# Patient Record
Sex: Female | Born: 1980
Health system: Southern US, Community
[De-identification: ages and names within clinical notes are randomized; demographics above are authoritative.]

## PROBLEM LIST (undated history)

## (undated) DIAGNOSIS — F988 Other specified behavioral and emotional disorders with onset usually occurring in childhood and adolescence: Secondary | ICD-10-CM

## (undated) DIAGNOSIS — F419 Anxiety disorder, unspecified: Secondary | ICD-10-CM

## (undated) DIAGNOSIS — F329 Major depressive disorder, single episode, unspecified: Secondary | ICD-10-CM

## (undated) DIAGNOSIS — D481 Neoplasm of uncertain behavior of connective and other soft tissue: Secondary | ICD-10-CM

## (undated) DIAGNOSIS — F603 Borderline personality disorder: Secondary | ICD-10-CM

## (undated) DIAGNOSIS — K219 Gastro-esophageal reflux disease without esophagitis: Secondary | ICD-10-CM

## (undated) DIAGNOSIS — F32A Depression, unspecified: Secondary | ICD-10-CM

## (undated) DIAGNOSIS — R159 Full incontinence of feces: Secondary | ICD-10-CM

## (undated) DIAGNOSIS — Z9884 Bariatric surgery status: Secondary | ICD-10-CM

## (undated) DIAGNOSIS — M170 Bilateral primary osteoarthritis of knee: Secondary | ICD-10-CM

## (undated) HISTORY — DX: Neoplasm of uncertain behavior of connective and other soft tissue: D48.1

## (undated) HISTORY — PX: CHOLECYSTECTOMY: SHX55

## (undated) HISTORY — DX: Borderline personality disorder: F60.3

## (undated) HISTORY — DX: Depression, unspecified: F32.A

## (undated) HISTORY — DX: Bilateral primary osteoarthritis of knee: M17.0

## (undated) HISTORY — DX: Anxiety disorder, unspecified: F41.9

## (undated) HISTORY — DX: Bariatric surgery status: Z98.84

## (undated) HISTORY — DX: Gastro-esophageal reflux disease without esophagitis: K21.9

## (undated) HISTORY — DX: Full incontinence of feces: R15.9

## (undated) HISTORY — DX: Other specified behavioral and emotional disorders with onset usually occurring in childhood and adolescence: F98.8

---
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
## 2006-07-23 HISTORY — PX: BLADDER SURGERY: SHX569

## 2006-07-23 HISTORY — PX: URETER REVISION: SHX493

## 2006-07-23 HISTORY — PX: ABDOMINAL HYSTERECTOMY: SHX81

## 2008-11-08 ENCOUNTER — Encounter: Payer: Self-pay | Admitting: Family Medicine

## 2014-07-23 HISTORY — PX: SLEEVE GASTROPLASTY: SHX1101

## 2015-04-25 DIAGNOSIS — K219 Gastro-esophageal reflux disease without esophagitis: Secondary | ICD-10-CM | POA: Insufficient documentation

## 2015-11-07 DIAGNOSIS — Z90710 Acquired absence of both cervix and uterus: Secondary | ICD-10-CM | POA: Insufficient documentation

## 2015-11-07 DIAGNOSIS — Z90721 Acquired absence of ovaries, unilateral: Secondary | ICD-10-CM | POA: Insufficient documentation

## 2017-03-21 DIAGNOSIS — F32A Depression, unspecified: Secondary | ICD-10-CM | POA: Insufficient documentation

## 2017-03-21 DIAGNOSIS — F419 Anxiety disorder, unspecified: Secondary | ICD-10-CM | POA: Insufficient documentation

## 2017-03-21 DIAGNOSIS — F603 Borderline personality disorder: Secondary | ICD-10-CM | POA: Insufficient documentation

## 2018-07-01 DIAGNOSIS — K629 Disease of anus and rectum, unspecified: Secondary | ICD-10-CM | POA: Insufficient documentation

## 2019-03-14 ENCOUNTER — Other Ambulatory Visit: Payer: Self-pay

## 2019-03-14 DIAGNOSIS — Z20822 Contact with and (suspected) exposure to covid-19: Secondary | ICD-10-CM

## 2019-03-15 LAB — NOVEL CORONAVIRUS, NAA: SARS-CoV-2, NAA: NOT DETECTED

## 2019-08-13 ENCOUNTER — Ambulatory Visit: Payer: Medicare Other | Attending: Internal Medicine

## 2019-08-13 ENCOUNTER — Other Ambulatory Visit: Payer: Self-pay

## 2019-08-13 ENCOUNTER — Ambulatory Visit (INDEPENDENT_AMBULATORY_CARE_PROVIDER_SITE_OTHER): Payer: Medicare Other | Admitting: Adult Health Nurse Practitioner

## 2019-08-13 VITALS — BP 114/88 | HR 100 | Temp 98.2°F | Ht 64.0 in | Wt 266.2 lb

## 2019-08-13 DIAGNOSIS — R430 Anosmia: Secondary | ICD-10-CM

## 2019-08-13 DIAGNOSIS — R432 Parageusia: Secondary | ICD-10-CM

## 2019-08-13 DIAGNOSIS — R0602 Shortness of breath: Secondary | ICD-10-CM

## 2019-08-13 DIAGNOSIS — Z20822 Contact with and (suspected) exposure to covid-19: Secondary | ICD-10-CM

## 2019-08-13 MED ORDER — ALBUTEROL SULFATE HFA 108 (90 BASE) MCG/ACT IN AERS: 2.0000 | INHALATION_SPRAY | Freq: Four times a day (QID) | RESPIRATORY_TRACT | 1 refills | Status: DC | PRN

## 2019-08-13 NOTE — Patient Instructions (Signed)
° ° ° °  If you have lab work done today you will be contacted with your lab results within the next 2 weeks.  If you have not heard from us then please contact us. The fastest way to get your results is to register for My Chart. ° ° °IF you received an x-ray today, you will receive an invoice from [facility]. Please contact [facility] at [phone] with questions or concerns regarding your invoice.  ° °IF you received labwork today, you will receive an invoice from [facility]. Please contact [facility] at [phone] with questions or concerns regarding your invoice.  ° °Our billing staff will not be able to assist you with questions regarding bills from these companies. ° °You will be contacted with the lab results as soon as they are available. The fastest way to get your results is to activate your My Chart account. Instructions are located on the last page of this paperwork. If you have not heard from us regarding the results in 2 weeks, please contact this office. °  ° ° ° °

## 2019-08-13 NOTE — Progress Notes (Signed)
Attempted to call the patient for virtual visit and left message.

## 2019-08-13 NOTE — Progress Notes (Signed)
Patient presented to the appointment with URI symptoms that she declined to communicate prior to being roomed. She currently has a loss of taste and smell and SOB.  I immediately asked the patient to get a Covid test and isolate until she receives results of a negative test.  If results are positive, she would need to quarantine for 10 days.  She requested an Albuterol inhaler.  I filled this and will call her for further information at a virtual visit this afternoon.   She verbalized understanding.

## 2019-08-14 LAB — NOVEL CORONAVIRUS, NAA: SARS-CoV-2, NAA: NOT DETECTED

## 2019-08-20 ENCOUNTER — Ambulatory Visit: Payer: Self-pay | Admitting: Family Medicine

## 2019-09-08 ENCOUNTER — Encounter: Payer: Medicare Other | Admitting: Family Medicine

## 2019-09-08 ENCOUNTER — Telehealth: Payer: Self-pay | Admitting: Adult Health Nurse Practitioner

## 2019-09-08 NOTE — Telephone Encounter (Signed)
Please Advise

## 2019-09-08 NOTE — Telephone Encounter (Signed)
Pt would like a curtsey refill on her  traZODone (DESYREL) 100 MG tablet [id number] (PHENERGAN) 12.5 MG tablet [id number], pantoprazole (PROTONIX) 40 MG tablet [id number] , gabapentin (NEURONTIN) 800 MG tablet [id number],  escitalopram (LEXAPRO) 10 MG tablet [id number]  clonazePAM (KLONOPIN) 2 MG tablet [id number] amphetamine-dextroamphetamine (ADDERALL XR) 30 MG 24 hr capsule [id number]  meloxicam (MOBIC) 15 MG tablet [id number]  Pharmacy  [place], [place].  [address] [place] [address]  Phone:  [phone] Fax:  [phone]     Pt has a upcoming appointment with Dr. Pamella Pert on 10/13/19 for a toc. Please advise pt at [phone]

## 2019-09-10 ENCOUNTER — Encounter: Payer: Self-pay | Admitting: Family Medicine

## 2019-09-10 NOTE — Telephone Encounter (Signed)
Can we find an appointment much sooner for this patient? thanks

## 2019-09-18 ENCOUNTER — Other Ambulatory Visit: Payer: Self-pay

## 2019-09-18 ENCOUNTER — Ambulatory Visit (INDEPENDENT_AMBULATORY_CARE_PROVIDER_SITE_OTHER): Payer: Medicare Other | Admitting: Family Medicine

## 2019-09-18 ENCOUNTER — Encounter: Payer: Self-pay | Admitting: Family Medicine

## 2019-09-18 VITALS — BP 127/85 | HR 100 | Temp 97.9°F | Ht 64.0 in | Wt 271.6 lb

## 2019-09-18 DIAGNOSIS — Z13220 Encounter for screening for lipoid disorders: Secondary | ICD-10-CM

## 2019-09-18 DIAGNOSIS — D481 Neoplasm of uncertain behavior of connective and other soft tissue: Secondary | ICD-10-CM | POA: Insufficient documentation

## 2019-09-18 DIAGNOSIS — F411 Generalized anxiety disorder: Secondary | ICD-10-CM

## 2019-09-18 DIAGNOSIS — K219 Gastro-esophageal reflux disease without esophagitis: Secondary | ICD-10-CM

## 2019-09-18 DIAGNOSIS — R159 Full incontinence of feces: Secondary | ICD-10-CM | POA: Insufficient documentation

## 2019-09-18 DIAGNOSIS — F988 Other specified behavioral and emotional disorders with onset usually occurring in childhood and adolescence: Secondary | ICD-10-CM | POA: Diagnosis not present

## 2019-09-18 DIAGNOSIS — F603 Borderline personality disorder: Secondary | ICD-10-CM

## 2019-09-18 DIAGNOSIS — Z9884 Bariatric surgery status: Secondary | ICD-10-CM | POA: Insufficient documentation

## 2019-09-18 DIAGNOSIS — F332 Major depressive disorder, recurrent severe without psychotic features: Secondary | ICD-10-CM

## 2019-09-18 DIAGNOSIS — Z131 Encounter for screening for diabetes mellitus: Secondary | ICD-10-CM

## 2019-09-18 DIAGNOSIS — Z79899 Other long term (current) drug therapy: Secondary | ICD-10-CM | POA: Diagnosis not present

## 2019-09-18 DIAGNOSIS — M170 Bilateral primary osteoarthritis of knee: Secondary | ICD-10-CM | POA: Insufficient documentation

## 2019-09-18 DIAGNOSIS — D4819 Other specified neoplasm of uncertain behavior of connective and other soft tissue: Secondary | ICD-10-CM | POA: Insufficient documentation

## 2019-09-18 MED ORDER — CLONAZEPAM 2 MG PO TABS: 2.0000 mg | ORAL_TABLET | Freq: Two times a day (BID) | ORAL | 0 refills | Status: DC

## 2019-09-18 MED ORDER — AMPHETAMINE-DEXTROAMPHET ER 30 MG PO CP24: 30.0000 mg | ORAL_CAPSULE | Freq: Every day | ORAL | 0 refills | Status: DC
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
## 2019-09-18 NOTE — Patient Instructions (Signed)
° ° ° °  If you have lab work done today you will be contacted with your lab results within the next 2 weeks.  If you have not heard from us then please contact us. The fastest way to get your results is to register for My Chart. ° ° °IF you received an x-ray today, you will receive an invoice from [facility]. Please contact [facility] at [phone] with questions or concerns regarding your invoice.  ° °IF you received labwork today, you will receive an invoice from [facility]. Please contact [facility] at [phone] with questions or concerns regarding your invoice.  ° °Our billing staff will not be able to assist you with questions regarding bills from these companies. ° °You will be contacted with the lab results as soon as they are available. The fastest way to get your results is to activate your My Chart account. Instructions are located on the last page of this paperwork. If you have not heard from us regarding the results in 2 weeks, please contact this office. °  ° ° ° °

## 2019-09-22 LAB — TOXASSURE SELECT 13 (MW), URINE

## 2019-09-23 ENCOUNTER — Encounter: Payer: Self-pay | Admitting: Family Medicine

## 2019-09-23 ENCOUNTER — Other Ambulatory Visit: Payer: Self-pay | Admitting: Family Medicine

## 2019-09-23 NOTE — Telephone Encounter (Signed)
Patient is requesting a refill of the following medications: Requested Prescriptions   Pending Prescriptions Disp Refills  . clonazePAM (KLONOPIN) 2 MG tablet 30 tablet 0    Sig: Take 1 tablet (2 mg total) by mouth 2 (two) times daily.    Date of patient request: 09/23/2019 Last office visit: 09/18/2019 Date of last refill: 09/18/2019 Last refill amount: 30 tablets twice daily Follow up time period per chart: Follow up appointment scheduled.   Patient stated that she sent you a picture of the same prescription of Clonazepam but for 60 tablets instead of 30 for the month , and was wondering if its possible if she could receive 60 tablets again.

## 2019-09-24 MED ORDER — CLONAZEPAM 2 MG PO TABS: 2.0000 mg | ORAL_TABLET | Freq: Two times a day (BID) | ORAL | 0 refills | Status: DC

## 2019-09-24 NOTE — Telephone Encounter (Signed)
Prescription corrected and resent

## 2019-09-24 NOTE — Telephone Encounter (Signed)
Already addressed in another message.

## 2019-09-25 ENCOUNTER — Ambulatory Visit: Payer: Medicare Other | Admitting: Family Medicine

## 2019-10-05 ENCOUNTER — Encounter: Payer: Self-pay | Admitting: Family Medicine

## 2019-10-05 NOTE — Telephone Encounter (Signed)
Pt is prescribed Adderall and is stating this is why it is positive. Please advise.

## 2019-10-05 NOTE — Telephone Encounter (Signed)
Pt has sent message asking why you placed the comment of "inapropriate" on their TOXASSURE UDS from 09/22/2019 looks positive for Methamphetamine. Please advise action.

## 2019-10-13 ENCOUNTER — Ambulatory Visit: Payer: Medicare Other

## 2019-10-13 ENCOUNTER — Telehealth: Payer: Self-pay | Admitting: Family Medicine

## 2019-10-13 ENCOUNTER — Encounter: Payer: Medicare Other | Admitting: Family Medicine

## 2019-10-19 ENCOUNTER — Ambulatory Visit (INDEPENDENT_AMBULATORY_CARE_PROVIDER_SITE_OTHER): Payer: Medicare Other | Admitting: Family Medicine

## 2019-10-19 ENCOUNTER — Other Ambulatory Visit: Payer: Self-pay

## 2019-10-19 ENCOUNTER — Encounter: Payer: Self-pay | Admitting: Family Medicine

## 2019-10-19 VITALS — BP 109/78 | HR 71 | Temp 98.0°F | Ht 64.0 in | Wt 263.6 lb

## 2019-10-19 DIAGNOSIS — Z131 Encounter for screening for diabetes mellitus: Secondary | ICD-10-CM | POA: Diagnosis not present

## 2019-10-19 DIAGNOSIS — F332 Major depressive disorder, recurrent severe without psychotic features: Secondary | ICD-10-CM | POA: Diagnosis not present

## 2019-10-19 DIAGNOSIS — Z9884 Bariatric surgery status: Secondary | ICD-10-CM

## 2019-10-19 DIAGNOSIS — Z01419 Encounter for gynecological examination (general) (routine) without abnormal findings: Secondary | ICD-10-CM

## 2019-10-19 DIAGNOSIS — F988 Other specified behavioral and emotional disorders with onset usually occurring in childhood and adolescence: Secondary | ICD-10-CM

## 2019-10-19 DIAGNOSIS — Z13220 Encounter for screening for lipoid disorders: Secondary | ICD-10-CM | POA: Diagnosis not present

## 2019-10-19 DIAGNOSIS — Z79899 Other long term (current) drug therapy: Secondary | ICD-10-CM | POA: Diagnosis not present

## 2019-10-19 DIAGNOSIS — Z0001 Encounter for general adult medical examination with abnormal findings: Secondary | ICD-10-CM | POA: Diagnosis not present

## 2019-10-19 DIAGNOSIS — Z0000 Encounter for general adult medical examination without abnormal findings: Secondary | ICD-10-CM

## 2019-10-19 MED ORDER — AMPHETAMINE-DEXTROAMPHET ER 30 MG PO CP24: 30.0000 mg | ORAL_CAPSULE | Freq: Every day | ORAL | 0 refills | Status: DC

## 2019-10-19 NOTE — Patient Instructions (Addendum)
   If you have lab work done today you will be contacted with your lab results within the next 2 weeks.  If you have not heard from us then please contact us. The fastest way to get your results is to register for My Chart.   IF you received an x-ray today, you will receive an invoice from [facility]. Please contact [facility] at [phone] with questions or concerns regarding your invoice.   IF you received labwork today, you will receive an invoice from [facility]. Please contact [facility] at [phone] with questions or concerns regarding your invoice.   Our billing staff will not be able to assist you with questions regarding bills from these companies.  You will be contacted with the lab results as soon as they are available. The fastest way to get your results is to activate your My Chart account. Instructions are located on the last page of this paperwork. If you have not heard from us regarding the results in 2 weeks, please contact this office.     Preventive Care 40-64 Years Old, Female Preventive care refers to visits with your health care provider and lifestyle choices that can promote health and wellness. This includes:  A yearly physical exam. This may also be called an annual well check.  Regular dental visits and eye exams.  Immunizations.  Screening for certain conditions.  Healthy lifestyle choices, such as eating a healthy diet, getting regular exercise, not using drugs or products that contain nicotine and tobacco, and limiting alcohol use. What can I expect for my preventive care visit? Physical exam Your health care provider will check your:  Height and weight. This may be used to calculate body mass index (BMI), which tells if you are at a healthy weight.  Heart rate and blood pressure.  Skin for abnormal spots. Counseling Your health care provider may ask you questions about your:  Alcohol, tobacco, and drug use.  Emotional  well-being.  Home and relationship well-being.  Sexual activity.  Eating habits.  Work and work environment.  Method of birth control.  Menstrual cycle.  Pregnancy history. What immunizations do I need?  Influenza (flu) vaccine  This is recommended every year. Tetanus, diphtheria, and pertussis (Tdap) vaccine  You may need a Td booster every 10 years. Varicella (chickenpox) vaccine  You may need this if you have not been vaccinated. Zoster (shingles) vaccine  You may need this after age 60. Measles, mumps, and rubella (MMR) vaccine  You may need at least one dose of MMR if you were born in 1957 or later. You may also need a second dose. Pneumococcal conjugate (PCV13) vaccine  You may need this if you have certain conditions and were not previously vaccinated. Pneumococcal polysaccharide (PPSV23) vaccine  You may need one or two doses if you smoke cigarettes or if you have certain conditions. Meningococcal conjugate (MenACWY) vaccine  You may need this if you have certain conditions. Hepatitis A vaccine  You may need this if you have certain conditions or if you travel or work in places where you may be exposed to hepatitis A. Hepatitis B vaccine  You may need this if you have certain conditions or if you travel or work in places where you may be exposed to hepatitis B. Haemophilus influenzae type b (Hib) vaccine  You may need this if you have certain conditions. Human papillomavirus (HPV) vaccine  If recommended by your health care provider, you may need three doses over 6 months.   You may receive vaccines as individual doses or as more than one vaccine together in one shot (combination vaccines). Talk with your health care provider about the risks and benefits of combination vaccines. What tests do I need? Blood tests  Lipid and cholesterol levels. These may be checked every 5 years, or more frequently if you are over 50 years old.  Hepatitis C  test.  Hepatitis B test. Screening  Lung cancer screening. You may have this screening every year starting at age 55 if you have a 30-pack-year history of smoking and currently smoke or have quit within the past 15 years.  Colorectal cancer screening. All adults should have this screening starting at age 50 and continuing until age 75. Your health care provider may recommend screening at age 45 if you are at increased risk. You will have tests every 1-10 years, depending on your results and the type of screening test.  Diabetes screening. This is done by checking your blood sugar (glucose) after you have not eaten for a while (fasting). You may have this done every 1-3 years.  Mammogram. This may be done every 1-2 years. Talk with your health care provider about when you should start having regular mammograms. This may depend on whether you have a family history of breast cancer.  BRCA-related cancer screening. This may be done if you have a family history of breast, ovarian, tubal, or peritoneal cancers.  Pelvic exam and Pap test. This may be done every 3 years starting at age 21. Starting at age 30, this may be done every 5 years if you have a Pap test in combination with an HPV test. Other tests  Sexually transmitted disease (STD) testing.  Bone density scan. This is done to screen for osteoporosis. You may have this scan if you are at high risk for osteoporosis. Follow these instructions at home: Eating and drinking  Eat a diet that includes fresh fruits and vegetables, whole grains, lean protein, and low-fat dairy.  Take vitamin and mineral supplements as recommended by your health care provider.  Do not drink alcohol if: ? Your health care provider tells you not to drink. ? You are pregnant, may be pregnant, or are planning to become pregnant.  If you drink alcohol: ? Limit how much you have to 0-1 drink a day. ? Be aware of how much alcohol is in your drink. In the [place] one  drink equals one 12 oz bottle of beer (355 mL), one 5 oz glass of wine (148 mL), or one 1 oz glass of hard liquor (44 mL). Lifestyle  Take daily care of your teeth and gums.  Stay active. Exercise for at least 30 minutes on 5 or more days each week.  Do not use any products that contain nicotine or tobacco, such as cigarettes, e-cigarettes, and chewing tobacco. If you need help quitting, ask your health care provider.  If you are sexually active, practice safe sex. Use a condom or other form of birth control (contraception) in order to prevent pregnancy and STIs (sexually transmitted infections).  If told by your health care provider, take low-dose aspirin daily starting at age 50. What's next?  Visit your health care provider once a year for a well check visit.  Ask your health care provider how often you should have your eyes and teeth checked.  Stay up to date on all vaccines. This information is not intended to replace advice given to you by your health care provider. Make sure   you discuss any questions you have with your health care provider. Document Revised: 03/20/2018 Document Reviewed: 03/20/2018 Elsevier Patient Education  2020 [employer]  
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
## 2019-10-20 LAB — HEMOGLOBIN A1C
Est. average glucose Bld gHb Est-mCnc: 117 mg/dL
Hgb A1c MFr Bld: 5.7 % — ABNORMAL HIGH (ref 4.8–5.6)

## 2019-10-20 LAB — CBC
Hematocrit: 40.7 % (ref 34.0–46.6)
Hemoglobin: 13.4 g/dL (ref 11.1–15.9)
MCH: 30.3 pg (ref 26.6–33.0)
MCHC: 32.9 g/dL (ref 31.5–35.7)
MCV: 92 fL (ref 79–97)
Platelets: 308 10*3/uL (ref 150–450)
RBC: 4.42 x10E6/uL (ref 3.77–5.28)
RDW: 14 % (ref 11.7–15.4)
WBC: 5.8 10*3/uL (ref 3.4–10.8)

## 2019-10-20 LAB — IRON,TIBC AND FERRITIN PANEL
Ferritin: 19 ng/mL (ref 15–150)
Iron Saturation: 10 % — ABNORMAL LOW (ref 15–55)
Iron: 36 ug/dL (ref 27–159)
Total Iron Binding Capacity: 377 ug/dL (ref 250–450)
UIBC: 341 ug/dL (ref 131–425)

## 2019-10-20 LAB — COMPREHENSIVE METABOLIC PANEL
ALT: 24 IU/L (ref 0–32)
AST: 27 IU/L (ref 0–40)
Albumin/Globulin Ratio: 1.7 (ref 1.2–2.2)
Albumin: 4.1 g/dL (ref 3.8–4.8)
Alkaline Phosphatase: 69 IU/L (ref 39–117)
BUN/Creatinine Ratio: 10 (ref 9–23)
BUN: 9 mg/dL (ref 6–24)
Bilirubin Total: 0.2 mg/dL (ref 0.0–1.2)
CO2: 22 mmol/L (ref 20–29)
Calcium: 9.4 mg/dL (ref 8.7–10.2)
Chloride: 106 mmol/L (ref 96–106)
Creatinine, Ser: 0.91 mg/dL (ref 0.57–1.00)
GFR calc Af Amer: 91 mL/min/{1.73_m2} (ref >59)
GFR calc non Af Amer: 79 mL/min/{1.73_m2} (ref >59)
Globulin, Total: 2.4 g/dL (ref 1.5–4.5)
Glucose: 89 mg/dL (ref 65–99)
Potassium: 4.1 mmol/L (ref 3.5–5.2)
Sodium: 142 mmol/L (ref 134–144)
Total Protein: 6.5 g/dL (ref 6.0–8.5)

## 2019-10-20 LAB — LIPID PANEL
Chol/HDL Ratio: 3.6 ratio (ref 0.0–4.4)
Cholesterol, Total: 161 mg/dL (ref 100–199)
HDL: 45 mg/dL (ref >39)
LDL Chol Calc (NIH): 87 mg/dL (ref 0–99)
Triglycerides: 171 mg/dL — ABNORMAL HIGH (ref 0–149)
VLDL Cholesterol Cal: 29 mg/dL (ref 5–40)

## 2019-10-20 LAB — VITAMIN B12: Vitamin B-12: >2000 pg/mL — ABNORMAL HIGH (ref 232–1245)

## 2019-10-20 LAB — TSH: TSH: 1.17 u[IU]/mL (ref 0.450–4.500)

## 2019-10-20 LAB — VITAMIN D 25 HYDROXY (VIT D DEFICIENCY, FRACTURES): Vit D, 25-Hydroxy: 52 ng/mL (ref 30.0–100.0)

## 2019-11-04 ENCOUNTER — Other Ambulatory Visit: Payer: Self-pay

## 2019-11-04 ENCOUNTER — Ambulatory Visit (HOSPITAL_COMMUNITY): Payer: Medicare Other | Admitting: Psychiatry

## 2019-11-09 ENCOUNTER — Other Ambulatory Visit: Payer: Self-pay | Admitting: Family Medicine

## 2019-11-09 DIAGNOSIS — F988 Other specified behavioral and emotional disorders with onset usually occurring in childhood and adolescence: Secondary | ICD-10-CM

## 2019-11-09 NOTE — Telephone Encounter (Signed)
Requested medication (s) are due for refill today: yes  Requested medication (s) are on the active medication list: yes  Last refill:  09/24/19  #60  0 refills  Future visit scheduled: No  Notes to clinic:  Not delegated    Requested Prescriptions  Pending Prescriptions Disp Refills   clonazePAM (KLONOPIN) 2 MG tablet [Pharmacy Med Name: clonazePAM 2 MG Oral Tablet] 30 tablet 0    Sig: Take 1 tablet by mouth twice daily      Not Delegated - Psychiatry:  Anxiolytics/Hypnotics Failed - 11/09/2019  5:22 PM      Failed - This refill cannot be delegated      Failed - Urine Drug Screen completed in last 360 days.      Passed - Valid encounter within last 6 months    Recent Outpatient Visits           3 weeks ago Encounter for annual wellness exam in Medicare patient   [facility], Lilia Argue, MD   1 month ago S/P laparoscopic sleeve gastrectomy   [facility], Lilia Argue, MD   2 months ago Loss of taste   [facility] at [facility], Lorelee Market, NP

## 2019-11-10 ENCOUNTER — Encounter: Payer: Self-pay | Admitting: Family Medicine

## 2019-11-10 ENCOUNTER — Other Ambulatory Visit: Payer: Self-pay | Admitting: Family Medicine

## 2019-11-10 MED ORDER — AMPHETAMINE-DEXTROAMPHET ER 30 MG PO CP24: 30.0000 mg | ORAL_CAPSULE | Freq: Every day | ORAL | 0 refills | Status: DC

## 2019-11-10 MED ORDER — CLONAZEPAM 2 MG PO TABS: 2.0000 mg | ORAL_TABLET | Freq: Two times a day (BID) | ORAL | 0 refills | Status: DC

## 2019-11-10 NOTE — Telephone Encounter (Signed)
Patient is requesting a refill of the following medications: Requested Prescriptions   Pending Prescriptions Disp Refills  . clonazePAM (KLONOPIN) 2 MG tablet [Pharmacy Med Name: clonazePAM 2 MG Oral Tablet] 30 tablet 0    Sig: Take 1 tablet by mouth twice daily    Date of patient request: 11/10/19 Last office visit: 10/19/2019 Date of last refill:09/24/2019 Last refill amount: 60 tablets Follow up time period per chart: N/A

## 2019-11-10 NOTE — Telephone Encounter (Signed)
pmp reviewd, appropriate meds refilled Has appt with psych April 29th

## 2019-11-10 NOTE — Telephone Encounter (Signed)
Patient is requesting a refill of the following medications: Requested Prescriptions   Pending Prescriptions Disp Refills  . clonazePAM (KLONOPIN) 2 MG tablet 60 tablet 0    Sig: Take 1 tablet (2 mg total) by mouth 2 (two) times daily.  Marland Kitchen amphetamine-dextroamphetamine (ADDERALL XR) 30 MG 24 hr capsule 30 capsule 0    Sig: Take 1 capsule (30 mg total) by mouth daily.    Date of patient request: 11/10/2019 Last office visit: 10/19/19 Date of last refill: 09/24/2019 & 10/19/2019 Last refill amount: 60 tablets Follow up time period per chart:N/A

## 2019-11-11 ENCOUNTER — Encounter: Payer: Self-pay | Admitting: Family Medicine

## 2019-11-11 NOTE — Telephone Encounter (Signed)
Called patient to let her know that she sent too many request that's why it was showing denied because the first prescription was already approved.

## 2019-11-12 MED ORDER — HYDROCODONE-ACETAMINOPHEN 10-325 MG PO TABS: 1.0000 | ORAL_TABLET | Freq: Four times a day (QID) | ORAL | 0 refills | Status: DC | PRN

## 2019-11-12 NOTE — Telephone Encounter (Signed)
Pt is requesting 1 week of pain medication for her oral surgery /tooth extraction is this possible? If she needs a visit is it possible to be a Video visit.

## 2019-11-13 ENCOUNTER — Telehealth: Payer: Self-pay | Admitting: Family Medicine

## 2019-11-13 NOTE — Telephone Encounter (Signed)
Pt declined collecting the 1 day Rx from dentist and requested the fill for the 1 week rx I clarified we were treating her post procedure pain just as he dentis had been. Pharmacy will dispense the 1 week Rx we had sent.

## 2019-11-13 NOTE — Telephone Encounter (Signed)
[facility] is calling  Because pt has  multiple prescriptions for hydocondone from dentist on 11/10/19 script was written hydrocodone 10-325 for 14 tablets a4 day supply . Then, On 11/11/2219 dentist sent another for 4 tablets to last one day . Then we wrote script  On 04.22/23 for a week . Pharmacy needs clarification on prescription .  Please call Dustin at [phone]

## 2019-11-19 ENCOUNTER — Encounter (HOSPITAL_COMMUNITY): Payer: Self-pay | Admitting: Psychiatry

## 2019-11-19 ENCOUNTER — Other Ambulatory Visit: Payer: Self-pay

## 2019-11-19 ENCOUNTER — Telehealth (INDEPENDENT_AMBULATORY_CARE_PROVIDER_SITE_OTHER): Payer: Medicare Other | Admitting: Psychiatry

## 2019-11-19 DIAGNOSIS — F39 Unspecified mood [affective] disorder: Secondary | ICD-10-CM

## 2019-11-19 DIAGNOSIS — F603 Borderline personality disorder: Secondary | ICD-10-CM

## 2019-11-19 DIAGNOSIS — F419 Anxiety disorder, unspecified: Secondary | ICD-10-CM

## 2019-11-19 DIAGNOSIS — F902 Attention-deficit hyperactivity disorder, combined type: Secondary | ICD-10-CM | POA: Diagnosis not present

## 2019-11-19 MED ORDER — BUPROPION HCL ER (XL) 300 MG PO TB24: 300.0000 mg | ORAL_TABLET | Freq: Every day | ORAL | 0 refills | Status: DC

## 2019-11-19 MED ORDER — AMPHETAMINE-DEXTROAMPHET ER 20 MG PO CP24: 20.0000 mg | ORAL_CAPSULE | Freq: Every day | ORAL | 0 refills | Status: DC

## 2019-11-19 MED ORDER — ARIPIPRAZOLE 5 MG PO TABS: ORAL_TABLET | ORAL | 0 refills | Status: DC

## 2019-11-19 MED ORDER — CLONAZEPAM 1 MG PO TABS: 1.0000 mg | ORAL_TABLET | Freq: Two times a day (BID) | ORAL | 0 refills | Status: DC | PRN
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
## 2019-11-24 ENCOUNTER — Telehealth: Payer: Self-pay

## 2019-11-24 ENCOUNTER — Telehealth: Payer: Self-pay | Admitting: Family Medicine

## 2019-11-24 NOTE — Telephone Encounter (Signed)
Doctor has the forms, will review and complete for the pt. My Olivia Vargas has been contacted about delay

## 2019-11-24 NOTE — Telephone Encounter (Signed)
Pt came in to drop off forms for doctor completion. Copies of the forms were given to asst to be kept at the nurses station. Call was made to [facility], spoke with Lexine Baton which sent message to Nonnie Done to inform that forms will be delayed due to doctor review. [employer] [id number] ext [phone], Claim Number [id number]. Forms will be faxed to [id number].

## 2019-11-24 NOTE — Telephone Encounter (Signed)
FMLA paperwork dropped off by patient put in provider. CMA box 05.04.2021 154.00 collected patient needs completed by 11/26/2019 if any delay contact Rocco  [employer] at [phone]   [phone]

## 2019-11-25 ENCOUNTER — Encounter: Payer: Self-pay | Admitting: Family Medicine

## 2019-11-26 ENCOUNTER — Other Ambulatory Visit: Payer: Self-pay

## 2019-11-26 NOTE — Telephone Encounter (Signed)
Please write patient letter. thanks

## 2019-12-02 ENCOUNTER — Encounter: Payer: Self-pay | Admitting: Family Medicine

## 2019-12-02 NOTE — Telephone Encounter (Signed)
Again please write patient a letter confirming that indeed she has long standing history of anxiety and is under the care of a psychiatrist and to please reconsider recent insurance denial for use of conscious sedation during dental procedure. thanks

## 2019-12-02 NOTE — Telephone Encounter (Signed)
Patient came by for a letter / still waiting for a note to sent to my chart  /please see her my chart message .    Patient is anxious please reach to patient and let her know where we are at on her request

## 2019-12-03 ENCOUNTER — Telehealth: Payer: Self-pay

## 2019-12-03 ENCOUNTER — Encounter: Payer: Self-pay | Admitting: Family Medicine

## 2019-12-03 NOTE — Telephone Encounter (Signed)
Patient informed letter for insurance is complete. Patient prefers to pick up in office .

## 2019-12-03 NOTE — Telephone Encounter (Signed)
Called pt to let her know that the fmla forms for [employer] have been faxed to [id number]. Pt also requested letter, letter given to pt

## 2019-12-15 ENCOUNTER — Telehealth (INDEPENDENT_AMBULATORY_CARE_PROVIDER_SITE_OTHER): Payer: Medicare Other | Admitting: Psychiatry

## 2019-12-15 ENCOUNTER — Other Ambulatory Visit: Payer: Self-pay

## 2019-12-15 DIAGNOSIS — F419 Anxiety disorder, unspecified: Secondary | ICD-10-CM | POA: Diagnosis not present

## 2019-12-15 DIAGNOSIS — F902 Attention-deficit hyperactivity disorder, combined type: Secondary | ICD-10-CM | POA: Diagnosis not present

## 2019-12-15 DIAGNOSIS — F603 Borderline personality disorder: Secondary | ICD-10-CM | POA: Diagnosis not present

## 2019-12-15 DIAGNOSIS — F39 Unspecified mood [affective] disorder: Secondary | ICD-10-CM | POA: Diagnosis not present

## 2019-12-15 MED ORDER — LAMOTRIGINE 25 MG PO TABS: ORAL_TABLET | ORAL | 1 refills | Status: DC

## 2019-12-15 MED ORDER — CLONAZEPAM 1 MG PO TABS: ORAL_TABLET | ORAL | 0 refills | Status: DC

## 2019-12-15 MED ORDER — AMPHETAMINE-DEXTROAMPHET ER 20 MG PO CP24: 20.0000 mg | ORAL_CAPSULE | Freq: Every day | ORAL | 0 refills | Status: DC

## 2019-12-15 MED ORDER — BUPROPION HCL ER (XL) 300 MG PO TB24: 300.0000 mg | ORAL_TABLET | Freq: Every day | ORAL | 0 refills | Status: DC

## 2019-12-15 NOTE — Progress Notes (Signed)
Virtual Visit via Video Note  I connected with Olivia Vargas on 12/15/19 at  2:00 PM EDT by a video enabled telemedicine application and verified that I am speaking with the correct person using two identifiers.   I discussed the limitations of evaluation and management by telemedicine and the availability of in person appointments. The patient expressed understanding and agreed to proceed.  Patient location; home Provider location; home office   History of Present Illness: Patient is evaluated by phone session.  She is a 39 year old Caucasian female who was seen 3 weeks ago as referred from her PCP Dr. Pamella Pert.  She was taking multiple and moderate dose of psychotropic medication.  She was taking Adderall, Klonopin, Lexapro, trazodone, Wellbutrin from her previous PCP in [facility].  She recently moved with her fianc and now like to establish care with she felt.  We started her on Abilify to help her mood lability, impulsive behavior, mania and anger.  However after few days of Abilify she stopped taking Abilify because she felt it was causing side effects.  She also read the side effects and she got scared.  However she did not specify the side effects.  We have discontinued trazodone and Lexapro and reduce the dose of Klonopin and Adderall.  She is on disability due to tremors of smooth muscles.  She lives with her fianc.  Both parents are deceased.  She has no children.  She is still struggling with her mood symptoms and sometimes gets irritable and angry.   Past Psychiatric History: H/O inpatient in 2014 at [facility].    Saw a day Mark and prescribed multiple medication.  Diagnosed with borderline personality disorder.  No h/o suicidal attempt.  H/O mood swings, impulsive behavior, anxiety and depression.  Given Adderall for ADD but no psychological testing.  Had tried Effexor, Cymbalta and lithium in the past.  Given Lexapro, Wellbutrin,  trazodone, Adderall and Klonopin by PCP at [facility] care, [facility] by Dr. Blondell Reveal.  We tried Abilify but she stopped after a few days with nonspecific side effects.   Psychiatric Specialty Exam: Physical Exam  Review of Systems  There were no vitals taken for this visit.There is no height or weight on file to calculate BMI.  General Appearance: Casual  Eye Contact:  Fair  Speech:  Slow  Volume:  Normal  Mood:  Anxious and Irritable  Affect:  Labile  Thought Process:  Descriptions of Associations: Intact  Orientation:  Full (Time, Place, and Person)  Thought Content:  Rumination  Suicidal Thoughts:  No  Homicidal Thoughts:  No  Memory:  Immediate;   Fair Recent;   Fair Remote;   Fair  Judgement:  Fair  Insight:  Fair  Psychomotor Activity:  Normal  Concentration:  Concentration: Fair and Attention Span: Fair  Recall:  [employer] of Knowledge:  Good  Language:  Good  Akathisia:  No  Handed:  Right  AIMS (if indicated):     Assets:  Communication Skills Desire for Improvement Housing  ADL's:  Intact  Cognition:  WNL  Sleep:   ok      Assessment and Plan: Mood disorder NOS.  Anxiety.  Borderline personality disorder.  ADHD.  I had a long discussion with the patient about taking multiple medication especially stimulants and benzodiazepine.  Patient has no formal testing for ADHD.  She stopped Abilify because she felt having side effects and she was scared with the  side effects that she read.  I will discontinue Abilify but insist that she should take something to help her mood lability.  We will try Lamictal which she has never tried before.  We will start 25 mg daily for 1 week and then 50 mg daily.  I explained if she develops rash then she need to stop the medication.  She will continue Wellbutrin XL 300 mg daily, she was taking Klonopin 2 mg twice a day and we had cut down to 1 mg twice a day and I like her to try Klonopin 1 mg at bedtime and  half tablet during the day as needed for severe anxiety.  I will continue Adderall XR 20 mg to help her focus and attention but she will need a psychological testing to rule out ADHD.  I discussed benzodiazepine dependence, tolerance, abuse and withdrawal symptoms.  So far patient has no side effects from the medication.  Recommend to call us back if she has any question or any concerns.  Follow-up in 4 weeks.  Time spent 25 minutes.  Follow Up Instructions:    I discussed the assessment and treatment plan with the patient. The patient was provided an opportunity to ask questions and all were answered. The patient agreed with the plan and demonstrated an understanding of the instructions.   The patient was advised to call back or seek an in-person evaluation if the symptoms worsen or if the condition fails to improve as anticipated.  I provided 25 minutes of non-face-to-face time during this encounter.   Kathlee Nations, MD

## 2019-12-22 ENCOUNTER — Ambulatory Visit (INDEPENDENT_AMBULATORY_CARE_PROVIDER_SITE_OTHER): Payer: Medicare Other | Admitting: Licensed Clinical Social Worker

## 2019-12-22 ENCOUNTER — Other Ambulatory Visit: Payer: Self-pay

## 2019-12-22 DIAGNOSIS — F603 Borderline personality disorder: Secondary | ICD-10-CM | POA: Diagnosis not present

## 2019-12-22 DIAGNOSIS — F39 Unspecified mood [affective] disorder: Secondary | ICD-10-CM

## 2019-12-22 DIAGNOSIS — F411 Generalized anxiety disorder: Secondary | ICD-10-CM
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
## 2020-01-14 ENCOUNTER — Telehealth (INDEPENDENT_AMBULATORY_CARE_PROVIDER_SITE_OTHER): Payer: Medicare Other | Admitting: Psychiatry

## 2020-01-14 ENCOUNTER — Other Ambulatory Visit: Payer: Self-pay

## 2020-01-14 DIAGNOSIS — F39 Unspecified mood [affective] disorder: Secondary | ICD-10-CM

## 2020-01-14 DIAGNOSIS — F419 Anxiety disorder, unspecified: Secondary | ICD-10-CM | POA: Diagnosis not present

## 2020-01-14 DIAGNOSIS — F902 Attention-deficit hyperactivity disorder, combined type: Secondary | ICD-10-CM | POA: Diagnosis not present

## 2020-01-14 MED ORDER — CLONAZEPAM 1 MG PO TABS: ORAL_TABLET | ORAL | 1 refills | Status: DC

## 2020-01-14 MED ORDER — AMPHETAMINE-DEXTROAMPHET ER 15 MG PO CP24: 15.0000 mg | ORAL_CAPSULE | Freq: Every day | ORAL | 0 refills | Status: AC

## 2020-01-14 MED ORDER — LAMOTRIGINE 25 MG PO TABS: ORAL_TABLET | ORAL | 1 refills | Status: DC

## 2020-01-14 MED ORDER — ESCITALOPRAM OXALATE 10 MG PO TABS: 10.0000 mg | ORAL_TABLET | Freq: Every day | ORAL | 1 refills | Status: AC

## 2020-01-14 MED ORDER — BUPROPION HCL ER (XL) 300 MG PO TB24: 300.0000 mg | ORAL_TABLET | Freq: Every day | ORAL | 1 refills | Status: DC

## 2020-01-14 NOTE — Progress Notes (Addendum)
Virtual Visit via Telephone Note  I connected with Olivia Vargas on 01/14/20 at  2:40 PM EDT by telephone and verified that I am speaking with the correct person using two identifiers.   I discussed the limitations, risks, security and privacy concerns of performing an evaluation and management service by telephone and the availability of in person appointments. I also discussed with the patient that there may be a patient responsible charge related to this service. The patient expressed understanding and agreed to proceed.   Patient Location: home Provider Location: Home Office  History of Present Illness: Patient is evaluated by phone session.  She is taking Lamictal and so far she feels it is helping her mood irritability and impulsive behavior.  Recently she visited her home [place] as she is trying to go back settled there.  She was cleaning the house and she is not sure if she got dust for exposed to [place] as she is feeling pitting issues and sometimes shortness of breath.  She has no fever.  She also not sure if the Lamictal has causing the side effects.  But overall she feels the Lamictal helped her mood and irritability.  She is sleeping okay.  She decided to go back on Lexapro but she is no longer taking Abilify.  We had reduced her stimulant and Klonopin but she is still taking every day.  We talked about controlled substance as patient does not have a formal diagnosis of ADHD.  However she insists that Adderall helped her focus attention.  She is also taking Wellbutrin.  She had 1 visit with Lovena Le, her therapist and she is hoping to have more appointments.  She denies any paranoia, hallucination or any suicidal thoughts.  She is on disability due to tremors of smooth muscles.  She lives with her fianc.  Past Psychiatric History: [facility]   Saw a day Mark and prescribed multiple medication.  Diagnosed with borderline  personality disorder.  No h/o suicidal attempt. H/O mood swings, impulsive behavior, anxiety and depression. Given Adderall for ADD but no psychological testing. Had tried Effexor, Cymbalta and lithium in the past. Given Lexapro, Wellbutrin, trazodone, Adderall and Klonopin by PCP at [facility] primary care, [facility] by Dr. Blondell Reveal.  We tried Abilify but she stopped after a few days with nonspecific side effects.   Psychiatric Specialty Exam: Physical Exam  Review of Systems  There were no vitals taken for this visit.There is no height or weight on file to calculate BMI.  General Appearance: NA  Eye Contact:  NA  Speech:  Slow  Volume:  Normal  Mood:  Anxious and Irritable  Affect:  NA  Thought Process:  Descriptions of Associations: Intact  Orientation:  Full (Time, Place, and Person)  Thought Content:  Rumination  Suicidal Thoughts:  No  Homicidal Thoughts:  No  Memory:  Immediate;   Good Recent;   Fair Remote;   Fair  Judgement:  Fair  Insight:  NA  Psychomotor Activity:  NA  Concentration:  Concentration: Fair and Attention Span: Fair  Recall:  Good  Fund of Knowledge:  Good  Language:  Good  Akathisia:  No  Handed:  Right  AIMS (if indicated):     Assets:  Communication Skills Desire for Improvement Housing Resilience Social Support  ADL's:  Intact  Cognition:  WNL  Sleep:   ok      Assessment and Plan: Mood disorder NOS.  Anxiety.  ADHD by history.  I discussed with the patient about taking multiple medication and stimulant and benzodiazepine use.  Once again I reinforced that she should get the psychological testing to rule out ADHD.  I also recommend she should see her PCP to find out the cause of cough as patient is not sure if it is a dust allergy or exposed to [place].  It is unlikely Lamictal because cough as this is not the common side effects.  She has no rash, itching, headaches or tremors.  She did not like the Lamictal and I  recommend if she is tolerating it okay then she can try taking 3 pills a day.  We talked about reducing stimulant and benzodiazepine.  Though she is not happy but she is willing to try.  She like to go back on Lexapro since it is helping her mood.  We will resume Lexapro 10 mg daily, continue Wellbutrin XL 300 mg daily and increase Lamictal 75 mg daily.  We will also reduce Adderall from 20 mg to 15 mg and Klonopin from 1.5 mg to only 1 mg at bedtime.  Patient is trying to get psychological testing.  Encouraged to continue therapy with her therapist.  I will forward my note to her PCP Dr. Pamella Pert as requested by patient.  I encourage she should follow-up with her.  Discussed safety concern that anytime having active suicidal thoughts or homicidal thought that she need to call 911 or go to local emergency room.  Follow-up in 2 months.    Follow Up Instructions:    I discussed the assessment and treatment plan with the patient. The patient was provided an opportunity to ask questions and all were answered. The patient agreed with the plan and demonstrated an understanding of the instructions.   The patient was advised to call back or seek an in-person evaluation if the symptoms worsen or if the condition fails to improve as anticipated.  I provided 20 minutes of non-face-to-face time during this encounter.   Kathlee Nations, MD

## 2020-02-08 ENCOUNTER — Telehealth (INDEPENDENT_AMBULATORY_CARE_PROVIDER_SITE_OTHER): Payer: Medicare Other | Admitting: Family Medicine

## 2020-02-08 ENCOUNTER — Encounter: Payer: Self-pay | Admitting: Family Medicine

## 2020-02-08 ENCOUNTER — Telehealth: Payer: Self-pay | Admitting: Family Medicine

## 2020-02-08 ENCOUNTER — Other Ambulatory Visit: Payer: Self-pay

## 2020-02-08 DIAGNOSIS — R062 Wheezing: Secondary | ICD-10-CM | POA: Diagnosis not present

## 2020-02-08 DIAGNOSIS — Z77120 Contact with and (suspected) exposure to mold (toxic): Secondary | ICD-10-CM

## 2020-02-08 MED ORDER — ALBUTEROL SULFATE HFA 108 (90 BASE) MCG/ACT IN AERS: 2.0000 | INHALATION_SPRAY | Freq: Four times a day (QID) | RESPIRATORY_TRACT | 2 refills | Status: AC | PRN

## 2020-02-08 MED ORDER — GABAPENTIN 800 MG PO TABS: ORAL_TABLET | ORAL | 1 refills | Status: AC

## 2020-02-08 MED ORDER — HYDROXYZINE HCL 25 MG PO TABS: 12.5000 mg | ORAL_TABLET | Freq: Three times a day (TID) | ORAL | 0 refills | Status: DC | PRN

## 2020-02-08 MED ORDER — PREDNISONE 20 MG PO TABS
40.0000 mg | ORAL_TABLET | Freq: Every day | ORAL | 0 refills | Status: DC
Start: 2020-02-08 — End: 2020-03-08

## 2020-02-08 NOTE — Telephone Encounter (Signed)
02/08/2020 - PATIENT HAD A DOXIMITY APPOINTMENT WITH DR. Benay Spice ON Monday (02/08/2020). DR. Benay Spice HAS REQUESTED SHE RETURN FOR A 1 WEEK FOLLOW-UP IN THE OFFICE NEXT Monday (02/15/2020). SHE HAS GIVEN HER PERMISSION TO OVER BOOK THIS PATIENT. I TRIED TO CALL AND SCHEDULE BUT HAD TO LEAVE A MESSAGE FOR HER TO RETURN MY CALL. [place]

## 2020-02-08 NOTE — Patient Instructions (Signed)
° ° ° °  If you have lab work done today you will be contacted with your lab results within the next 2 weeks.  If you have not heard from us then please contact us. The fastest way to get your results is to register for My Chart. ° ° °IF you received an x-ray today, you will receive an invoice from [facility]. Please contact [facility] at [phone] with questions or concerns regarding your invoice.  ° °IF you received labwork today, you will receive an invoice from [facility]. Please contact [facility] at [phone] with questions or concerns regarding your invoice.  ° °Our billing staff will not be able to assist you with questions regarding bills from these companies. ° °You will be contacted with the lab results as soon as they are available. The fastest way to get your results is to activate your My Chart account. Instructions are located on the last page of this paperwork. If you have not heard from us regarding the results in 2 weeks, please contact this office. °  ° ° ° °

## 2020-02-08 NOTE — Progress Notes (Signed)
Virtual Visit Note  I connected with patient on 02/08/20 at 1128am by video doximity and verified that I am speaking with the correct person using two identifiers. Olivia Vargas is currently located at home and patient is currently with them during visit. The provider, Rutherford Guys, MD is located in their home at time of visit.  I discussed the limitations, risks, security and privacy concerns of performing an evaluation and management service by telephone and the availability of in person appointments. I also discussed with the patient that there may be a patient responsible charge related to this service. The patient expressed understanding and agreed to proceed.   I provided 11 minutes of non-face-to-face time during this encounter.  Chief Complaint  Patient presents with  . URI    wheezing, chest congested/ cough , runny nose/ itching eyes & all over   . Medication Management    taking self off lamictal - requesting trazadone rx     HPI ? Having runny nose, nasal congestion, very itchy (no rash) Coughing, mildly productive Having chills, no sweats, nofevers Itchy throat, watery eyes No real sneezing Started about a week ago Has mold in her apt Has air purifier Having SOB and wheezing for past 3-4 months Albuterol not helping Quit smoking 2 day ago Has childhood asthma  Takes gabapentin for neuropathy Sees psych regularly, adj meds, last ov June 2021  No Known Allergies  Prior to Admission medications   Medication Sig Start Date End Date Taking? Authorizing Provider  amphetamine-dextroamphetamine (ADDERALL XR) 15 MG 24 hr capsule Take 1 capsule by mouth daily. 01/14/20 02/13/20 Yes Arfeen, Arlyce Harman, MD  buPROPion (WELLBUTRIN XL) 300 MG 24 hr tablet Take 1 tablet (300 mg total) by mouth daily. 01/14/20  Yes Arfeen, Arlyce Harman, MD  clonazePAM (KLONOPIN) 1 MG tablet Take one tab at bed time. 01/14/20  Yes Arfeen, Arlyce Harman, MD  diphenhydrAMINE (BENADRYL) 25 MG tablet Take 25 mg by  mouth every 6 (six) hours as needed.   Yes [provider]  escitalopram (LEXAPRO) 10 MG tablet Take 1 tablet (10 mg total) by mouth daily. 01/14/20  Yes Arfeen, Arlyce Harman, MD  gabapentin (NEURONTIN) 800 MG tablet Take 1 Tablet by Mouth in The Morning and at Bedtime 08/11/19  Yes [provider]  linaclotide (LINZESS) 145 MCG CAPS capsule Take by mouth. 01/02/19  Yes [provider]  meloxicam (MOBIC) 15 MG tablet Take by mouth. 08/11/19  Yes [provider]  Multiple Vitamin (THERA) TABS Take by mouth.   Yes [provider]  pantoprazole (PROTONIX) 40 MG tablet Take by mouth. 08/11/19  Yes [provider]  promethazine (PHENERGAN) 12.5 MG tablet Take by mouth. 08/11/19  Yes [provider]  lamoTRIgine (LAMICTAL) 25 MG tablet Take three tab daily Patient not taking: Reported on 02/08/2020 01/14/20   Kathlee Nations, MD    Past Medical History:  Diagnosis Date  . ADD (attention deficit disorder)   . Anxiety   . Bilateral primary osteoarthritis of knee   . Borderline personality disorder ([place])   . Depression   . GERD (gastroesophageal reflux disease)   . S/P laparoscopic sleeve gastrectomy   . Smooth muscle tumor   . Stool incontinence     Past Surgical History:  Procedure Laterality Date  . ABDOMINAL HYSTERECTOMY  2008   benign smooth muscle tumors  . BLADDER SURGERY  2008  . CHOLECYSTECTOMY    . SLEEVE GASTROPLASTY  2016  . URETER REVISION  2008    Social History   Tobacco Use  . Smoking status: Current Every Day Smoker    Packs/day: 0.50    Types: Cigarettes  . Smokeless tobacco: Never Used  Substance Use Topics  . Alcohol use: Not Currently    Family History  Problem Relation Age of Onset  . Healthy Brother     ROS Per hpi  Objective  Vitals as reported by the patient: none  GEN: AAOx3, NAD HEENT: [place]/AT, pupils are symmetrical, EOMI, non-icteric sclera Resp: breathing comfortably, speaking in full  sentences Skin: no rashes noted, no pallor Psych: good eye contact, normal mood and affect   ASSESSMENT and PLAN  1. Wheezing 2. Mold exposure Discussed supportive measures, new meds r/se/b and RTC precautions.   Other orders - gabapentin (NEURONTIN) 800 MG tablet; Take 1 Tablet by Mouth in The Morning and at Bedtime - albuterol (VENTOLIN HFA) 108 (90 Base) MCG/ACT inhaler; Inhale 2 puffs into the lungs every 6 (six) hours as needed for wheezing or shortness of breath. - predniSONE (DELTASONE) 20 MG tablet; Take 2 tablets (40 mg total) by mouth daily with breakfast. - hydrOXYzine (ATARAX/VISTARIL) 25 MG tablet; Take 0.5-1 tablets (12.5-25 mg total) by mouth every 8 (eight) hours as needed for itching.  FOLLOW-UP: 1 week, in office   The above assessment and management plan was discussed with the patient. The patient verbalized understanding of and has agreed to the management plan. Patient is aware to call the clinic if symptoms persist or worsen. Patient is aware when to return to the clinic for a follow-up visit. Patient educated on when it is appropriate to go to the emergency department.     Rutherford Guys, MD Primary Care at [place], [place] [address] Ph.  [phone] Fax [phone]

## 2020-02-10 ENCOUNTER — Other Ambulatory Visit: Payer: Self-pay

## 2020-02-10 ENCOUNTER — Encounter (HOSPITAL_COMMUNITY): Payer: Self-pay | Admitting: Psychiatry

## 2020-02-10 ENCOUNTER — Ambulatory Visit (INDEPENDENT_AMBULATORY_CARE_PROVIDER_SITE_OTHER): Payer: Medicare Other | Admitting: Psychiatry

## 2020-02-10 DIAGNOSIS — F4310 Post-traumatic stress disorder, unspecified: Secondary | ICD-10-CM | POA: Diagnosis not present

## 2020-02-10 DIAGNOSIS — F603 Borderline personality disorder: Secondary | ICD-10-CM | POA: Diagnosis not present

## 2020-02-10 DIAGNOSIS — F411 Generalized anxiety disorder: Secondary | ICD-10-CM | POA: Diagnosis not present

## 2020-02-10 NOTE — Progress Notes (Signed)
Virtual Visit via Video Note  I connected with Olivia Vargas on 02/10/20 at  1:30 PM EDT by a video enabled telemedicine application and verified that I am speaking with the correct person using two identifiers.  Location: Patient: Patient Home Provider: Home Office   I discussed the limitations of evaluation and management by telemedicine and the availability of in person appointments. The patient expressed understanding and agreed to proceed.  History of Present Illness: Borderline Personality Disorder, GAD and PTSD   Treatment Plan Goals: 1) Client will report decreased depressive and anxiety sx 3/4 days per week.  2) Client will complete all assigned therapeutic homework.  3) Client will report any SI/HI/psychosis to respective therapist.  Observations/Objective: Counselor met with Client for individual therapy via Webex. Counselor assessed MH symptoms and progress on treatment plan goals, with patient reporting currently experiencing symptoms related to impact of black mold in her home. Client attempted to engage in session, however, she presented with a cough, shortness of breath, lethargy, rapid heart beat, muscle twitches and watery eyes. Client presents with moderate depression and moderate anxiety. Client denied suicidal ideation or self-harm behaviors.   Counselor prompted Client to share about current needs, support system, history of MI, and concerns with current treatment. Client shared about current relationships status, housing status, financial status, and how they impact her anxiety, trauma responses and depression. Counselor provided information on panic attack management, walking the Client through application of skills. Client expressed confusion and anger about new diagnosis of Borderline Personality DO. Counselor shared psychoeducation on diagnosis with the Client from the DSM-5. Client expressed a better understanding of the dx and agreed with the dx as accurate. Client  stated that she felt more comfortable with moving forward in treatment for medication management and therapy. Counselor to schedule follow up appointment. Client to contact medical provider to address black mold reaction.   Assessment and Plan: Counselor will continue to meet with patient to address treatment plan goals. Patient will continue to follow recommendations of providers and implement skills learned in session.  Follow Up Instructions: Counselor will send information for next session via Webex.    The patient was advised to call back or seek an in-person evaluation if the symptoms worsen or if the condition fails to improve as anticipated.  I provided 45 minutes of non-face-to-face time during this encounter.   Lise Auer, LCSW

## 2020-02-15 ENCOUNTER — Ambulatory Visit (INDEPENDENT_AMBULATORY_CARE_PROVIDER_SITE_OTHER): Payer: Medicare Other | Admitting: Family Medicine

## 2020-02-15 ENCOUNTER — Encounter: Payer: Self-pay | Admitting: Family Medicine

## 2020-02-15 ENCOUNTER — Ambulatory Visit (INDEPENDENT_AMBULATORY_CARE_PROVIDER_SITE_OTHER): Payer: Medicare Other

## 2020-02-15 ENCOUNTER — Other Ambulatory Visit: Payer: Self-pay

## 2020-02-15 VITALS — BP 134/84 | HR 107 | Temp 97.2°F | Ht 64.0 in | Wt 282.0 lb

## 2020-02-15 DIAGNOSIS — Z9109 Other allergy status, other than to drugs and biological substances: Secondary | ICD-10-CM | POA: Diagnosis not present

## 2020-02-15 DIAGNOSIS — J4521 Mild intermittent asthma with (acute) exacerbation: Secondary | ICD-10-CM

## 2020-02-15 DIAGNOSIS — R05 Cough: Secondary | ICD-10-CM | POA: Diagnosis not present

## 2020-02-15 DIAGNOSIS — R059 Cough, unspecified: Secondary | ICD-10-CM

## 2020-02-15 MED ORDER — FLOVENT HFA 44 MCG/ACT IN AERO: 2.0000 | INHALATION_SPRAY | Freq: Two times a day (BID) | RESPIRATORY_TRACT | 2 refills | Status: AC

## 2020-02-15 MED ORDER — PROMETHAZINE HCL 12.5 MG PO TABS: 12.5000 mg | ORAL_TABLET | Freq: Three times a day (TID) | ORAL | 3 refills | Status: AC | PRN

## 2020-02-15 MED ORDER — HYDROXYZINE HCL 25 MG PO TABS: 12.5000 mg | ORAL_TABLET | Freq: Three times a day (TID) | ORAL | 0 refills | Status: DC | PRN

## 2020-02-15 MED ORDER — PANTOPRAZOLE SODIUM 40 MG PO TBEC: 40.0000 mg | DELAYED_RELEASE_TABLET | Freq: Every day | ORAL | 3 refills | Status: AC

## 2020-02-15 NOTE — Progress Notes (Signed)
7/26/20214:42 PM  Olivia Vargas 01/23/1981, 39 y.o., female [id number]  Chief Complaint  Patient presents with  . cough and congestion    causing not to get enough air in lungs/ debilitating   . fevers    cold chills / taking tylenol PM x 2 weeks   . exposure to mold    in apartment     HPI:   Patient is a 39 y.o. female with past medical history significant for GAD, MDD,insomnia, ADD, borderline personality disorder, possible mood disorder, GERD, vitamin B12 deficiency, s/p gastric lap band, smooth muscle tumors  who presents today for routine followup  Last visit 2 weeks ago - telemedicine Hydroxyzine, Albuterol and pred for wheezing, mold exposure  Continues to struggle with breathing but better Prednisone and albuterol helping Still having cough and wheezing Productive cough - thick white mucous Still itchy - vistaril is helping Mold in apartment has spread from area where water heater exploded and has spread under linoleum Feeling hot and cold, feeling nauseous Her boyfriend is also sick Has not had covid vaccine Last albuterol use before this appt  Depression screen [facility] 2/9 02/15/2020 02/08/2020 10/19/2019  Decreased Interest 0 0 3  Down, Depressed, Hopeless 0 0 3  PHQ - 2 Score 0 0 6  Altered sleeping - - 3  Tired, decreased energy - - 3  Change in appetite - - 3  Feeling bad or failure about yourself  - - 3  Trouble concentrating - - 3  Moving slowly or fidgety/restless - - 3  Suicidal thoughts - - 0  PHQ-9 Score - - 24  Difficult doing work/chores - - Very difficult    Fall Risk  02/15/2020 02/08/2020 10/19/2019 08/13/2019  Falls in the past year? 0 0 0 0  Number falls in past yr: 0 0 0 0  Injury with Fall? 0 0 0 0  Follow up Falls evaluation completed Falls evaluation completed - Falls evaluation completed     No Known Allergies  Prior to Admission medications   Medication Sig Start Date End Date Taking? Authorizing Provider  albuterol (VENTOLIN HFA)  108 (90 Base) MCG/ACT inhaler Inhale 2 puffs into the lungs every 6 (six) hours as needed for wheezing or shortness of breath. 02/08/20  Yes Rutherford Guys, MD  buPROPion (WELLBUTRIN XL) 300 MG 24 hr tablet Take 1 tablet (300 mg total) by mouth daily. 01/14/20  Yes Arfeen, Arlyce Harman, MD  clonazePAM (KLONOPIN) 1 MG tablet Take one tab at bed time. 01/14/20  Yes Arfeen, Arlyce Harman, MD  diphenhydrAMINE (BENADRYL) 25 MG tablet Take 25 mg by mouth every 6 (six) hours as needed.   Yes [provider]  diphenhydrAMINE-APAP, sleep, (TYLENOL PM EXTRA STRENGTH PO) Take by mouth.   Yes [provider]  escitalopram (LEXAPRO) 10 MG tablet Take 1 tablet (10 mg total) by mouth daily. 01/14/20  Yes Arfeen, Arlyce Harman, MD  gabapentin (NEURONTIN) 800 MG tablet Take 1 Tablet by Mouth in The Morning and at Bedtime 02/08/20  Yes Rutherford Guys, MD  hydrOXYzine (ATARAX/VISTARIL) 25 MG tablet Take 0.5-1 tablets (12.5-25 mg total) by mouth every 8 (eight) hours as needed for itching. 02/08/20  Yes Rutherford Guys, MD  linaclotide [facility]) 145 MCG CAPS capsule Take by mouth. 01/02/19  Yes [provider]  meloxicam (MOBIC) 15 MG tablet Take by mouth. 08/11/19  Yes [provider]  Multiple Vitamin (THERA) TABS Take by mouth.   Yes [provider]  pantoprazole (  PROTONIX) 40 MG tablet Take by mouth. 08/11/19  Yes [provider]  predniSONE (DELTASONE) 20 MG tablet Take 2 tablets (40 mg total) by mouth daily with breakfast. 02/08/20  Yes Rutherford Guys, MD  promethazine (PHENERGAN) 12.5 MG tablet Take by mouth. 08/11/19  Yes [provider]  amphetamine-dextroamphetamine (ADDERALL XR) 15 MG 24 hr capsule Take 1 capsule by mouth daily. 01/14/20 02/13/20  Kathlee Nations, MD  lamoTRIgine (LAMICTAL) 25 MG tablet Take three tab daily Patient not taking: Reported on 02/15/2020 01/14/20   Kathlee Nations, MD    Past Medical History:  Diagnosis Date  . ADD (attention deficit disorder)    . Anxiety   . Bilateral primary osteoarthritis of knee   . Borderline personality disorder ([place])   . Depression   . GERD (gastroesophageal reflux disease)   . S/P laparoscopic sleeve gastrectomy   . Smooth muscle tumor   . Stool incontinence     Past Surgical History:  Procedure Laterality Date  . ABDOMINAL HYSTERECTOMY  2008   benign smooth muscle tumors  . BLADDER SURGERY  2008  . CHOLECYSTECTOMY    . SLEEVE GASTROPLASTY  2016  . URETER REVISION  2008    Social History   Tobacco Use  . Smoking status: Current Every Day Smoker    Packs/day: 0.50    Types: Cigarettes  . Smokeless tobacco: Never Used  Substance Use Topics  . Alcohol use: Not Currently    Family History  Problem Relation Age of Onset  . Healthy Brother     ROS Per hpi  OBJECTIVE:  Today's Vitals   02/15/20 1612  BP: (!) 134/84  Pulse: (!) 107  Temp: (!) 97.2 F (36.2 C)  SpO2: 96%  Weight: (!) 282 lb (127.9 kg)  Height: 5\' 4"  (1.626 m)   Body mass index is 48.41 kg/m.   Physical Exam Vitals and nursing note reviewed.  Constitutional:      Appearance: She is well-developed.  HENT:     Head: Normocephalic and atraumatic.     Mouth/Throat:     Pharynx: No oropharyngeal exudate.  Eyes:     General: No scleral icterus.    Extraocular Movements: Extraocular movements intact.     Conjunctiva/sclera: Conjunctivae normal.     Pupils: Pupils are equal, round, and reactive to light.  Cardiovascular:     Rate and Rhythm: Normal rate and regular rhythm.     Heart sounds: Normal heart sounds. No murmur heard.  No friction rub. No gallop.   Pulmonary:     Effort: Pulmonary effort is normal.     Breath sounds: Normal breath sounds. No wheezing, rhonchi or rales.  Musculoskeletal:     Cervical back: Neck supple.  Skin:    General: Skin is warm and dry.  Neurological:     Mental Status: She is alert and oriented to person, place, and time.     No results found for this or any  previous visit (from the past 24 hour(s)).  No results found.   ASSESSMENT and PLAN  1. Mild intermittent reactive airway disease with acute exacerbation 2. Environmental allergies Starting flovent inhaler, reviewed r/se/b. RTC precautions  3. Cough - CBC with Differential/Platelet - DG Chest 2 View - official reading pending, per my read negative - Novel Coronavirus, NAA ([facility])  Other orders - pantoprazole (PROTONIX) 40 MG tablet; Take 1 tablet (40 mg total) by mouth daily. - hydrOXYzine (ATARAX/VISTARIL) 25 MG tablet; Take 0.5-1 tablets (12.5-25 mg  total) by mouth every 8 (eight) hours as needed for itching. - promethazine (PHENERGAN) 12.5 MG tablet; Take 1 tablet (12.5 mg total) by mouth every 8 (eight) hours as needed for nausea or vomiting. - fluticasone (FLOVENT HFA) 44 MCG/ACT inhaler; Inhale 2 puffs into the lungs 2 (two) times daily.  Return if symptoms worsen or fail to improve.    Rutherford Guys, MD Primary Care at [place], [place] [address] Ph.  [phone] Fax [phone]

## 2020-02-15 NOTE — Patient Instructions (Signed)
° ° ° °  If you have lab work done today you will be contacted with your lab results within the next 2 weeks.  If you have not heard from us then please contact us. The fastest way to get your results is to register for My Chart. ° ° °IF you received an x-ray today, you will receive an invoice from [facility]. Please contact [facility] at [phone] with questions or concerns regarding your invoice.  ° °IF you received labwork today, you will receive an invoice from [facility]. Please contact [facility] at [phone] with questions or concerns regarding your invoice.  ° °Our billing staff will not be able to assist you with questions regarding bills from these companies. ° °You will be contacted with the lab results as soon as they are available. The fastest way to get your results is to activate your My Chart account. Instructions are located on the last page of this paperwork. If you have not heard from us regarding the results in 2 weeks, please contact this office. °  ° ° ° °

## 2020-02-16 ENCOUNTER — Encounter: Payer: Self-pay | Admitting: Family Medicine

## 2020-02-16 LAB — CBC WITH DIFFERENTIAL/PLATELET
Basophils Absolute: 0.1 10*3/uL (ref 0.0–0.2)
Basos: 1 %
EOS (ABSOLUTE): 0.5 10*3/uL — ABNORMAL HIGH (ref 0.0–0.4)
Eos: 6 %
Hematocrit: 41.6 % (ref 34.0–46.6)
Hemoglobin: 13.3 g/dL (ref 11.1–15.9)
Immature Grans (Abs): 0.1 10*3/uL (ref 0.0–0.1)
Immature Granulocytes: 1 %
Lymphocytes Absolute: 2.4 10*3/uL (ref 0.7–3.1)
Lymphs: 27 %
MCH: 27.9 pg (ref 26.6–33.0)
MCHC: 32 g/dL (ref 31.5–35.7)
MCV: 87 fL (ref 79–97)
Monocytes Absolute: 0.7 10*3/uL (ref 0.1–0.9)
Monocytes: 7 %
Neutrophils Absolute: 5.2 10*3/uL (ref 1.4–7.0)
Neutrophils: 58 %
Platelets: 364 10*3/uL (ref 150–450)
RBC: 4.76 x10E6/uL (ref 3.77–5.28)
RDW: 14.5 % (ref 11.7–15.4)
WBC: 8.9 10*3/uL (ref 3.4–10.8)

## 2020-02-16 NOTE — Telephone Encounter (Signed)
Pt would like to know what EOS elevation means please advise

## 2020-02-29 ENCOUNTER — Telehealth (HOSPITAL_COMMUNITY): Payer: Medicare Other | Admitting: Psychiatry

## 2020-02-29 ENCOUNTER — Other Ambulatory Visit: Payer: Self-pay

## 2020-03-08 ENCOUNTER — Ambulatory Visit (INDEPENDENT_AMBULATORY_CARE_PROVIDER_SITE_OTHER): Payer: Medicare Other | Admitting: Family Medicine

## 2020-03-08 ENCOUNTER — Other Ambulatory Visit: Payer: Self-pay

## 2020-03-08 ENCOUNTER — Encounter: Payer: Self-pay | Admitting: Family Medicine

## 2020-03-08 VITALS — BP 116/80 | HR 79 | Temp 97.7°F | Ht 64.0 in | Wt 277.0 lb

## 2020-03-08 DIAGNOSIS — J4530 Mild persistent asthma, uncomplicated: Secondary | ICD-10-CM

## 2020-03-08 DIAGNOSIS — H6982 Other specified disorders of Eustachian tube, left ear: Secondary | ICD-10-CM

## 2020-03-08 DIAGNOSIS — Z9109 Other allergy status, other than to drugs and biological substances: Secondary | ICD-10-CM | POA: Diagnosis not present

## 2020-03-08 MED ORDER — MONTELUKAST SODIUM 10 MG PO TABS
10.0000 mg | ORAL_TABLET | Freq: Every day | ORAL | 3 refills | Status: AC
Start: 2020-03-08 — End: ?

## 2020-03-08 NOTE — Progress Notes (Signed)
8/17/202111:42 AM  Olivia Vargas May 10, 1981, 39 y.o., female [id number]  Chief Complaint  Patient presents with  . Pain    pain in the left ear for the past wk, cannot hear out of the ear. No wax noticed at triage    HPI:   Patient is a 39 y.o. female with past medical history significant for GAD, MDD,insomnia,ADD, borderline personality disorder, possible mood disorder,GERD, vitamin B12 deficiency, s/p gastric lap band, smooth muscle tumors who presents today for left ear pain/decreased hearing  1.5 week of left ear pain, popping, decreased hearing Reports nasal congestion, cough, sneezing Asthma improved with adding flovent inhaler but still having DOE No wheezing Using flonase intermittently Mold has been cleaned out of her apartment No fever or chills   Depression screen [facility] 2/9 02/15/2020 02/08/2020 10/19/2019  Decreased Interest 0 0 3  Down, Depressed, Hopeless 0 0 3  PHQ - 2 Score 0 0 6  Altered sleeping - - 3  Tired, decreased energy - - 3  Change in appetite - - 3  Feeling bad or failure about yourself  - - 3  Trouble concentrating - - 3  Moving slowly or fidgety/restless - - 3  Suicidal thoughts - - 0  PHQ-9 Score - - 24  Difficult doing work/chores - - Very difficult    Fall Risk  03/08/2020 02/15/2020 02/08/2020 10/19/2019 08/13/2019  Falls in the past year? 0 0 0 0 0  Number falls in past yr: 0 0 0 0 0  Injury with Fall? 0 0 0 0 0  Follow up - Falls evaluation completed Falls evaluation completed - Falls evaluation completed     No Known Allergies  Prior to Admission medications   Medication Sig Start Date End Date Taking? Authorizing Provider  albuterol (VENTOLIN HFA) 108 (90 Base) MCG/ACT inhaler Inhale 2 puffs into the lungs every 6 (six) hours as needed for wheezing or shortness of breath. 02/08/20  Yes Rutherford Guys, MD  escitalopram (LEXAPRO) 10 MG tablet Take 1 tablet (10 mg total) by mouth daily. 01/14/20  Yes Arfeen, Arlyce Harman, MD  fluticasone  (FLOVENT HFA) 44 MCG/ACT inhaler Inhale 2 puffs into the lungs 2 (two) times daily. 02/15/20  Yes Rutherford Guys, MD  gabapentin (NEURONTIN) 800 MG tablet Take 1 Tablet by Mouth in The Morning and at Bedtime 02/08/20  Yes Rutherford Guys, MD  meloxicam (MOBIC) 15 MG tablet Take by mouth. 08/11/19  Yes [provider]  Multiple Vitamin (THERA) TABS Take by mouth.   Yes [provider]  pantoprazole (PROTONIX) 40 MG tablet Take 1 tablet (40 mg total) by mouth daily. 02/15/20  Yes Rutherford Guys, MD  promethazine (PHENERGAN) 12.5 MG tablet Take 1 tablet (12.5 mg total) by mouth every 8 (eight) hours as needed for nausea or vomiting. 02/15/20  Yes Rutherford Guys, MD  amphetamine-dextroamphetamine (ADDERALL XR) 15 MG 24 hr capsule Take 1 capsule by mouth daily. 01/14/20 02/13/20  Kathlee Nations, MD    Past Medical History:  Diagnosis Date  . ADD (attention deficit disorder)   . Anxiety   . Bilateral primary osteoarthritis of knee   . Borderline personality disorder ([place])   . Depression   . GERD (gastroesophageal reflux disease)   . S/P laparoscopic sleeve gastrectomy   . Smooth muscle tumor   . Stool incontinence     Past Surgical History:  Procedure Laterality Date  . ABDOMINAL HYSTERECTOMY  2008   benign smooth muscle tumors  .  BLADDER SURGERY  2008  . CHOLECYSTECTOMY    . SLEEVE GASTROPLASTY  2016  . URETER REVISION  2008    Social History   Tobacco Use  . Smoking status: Current Every Day Smoker    Packs/day: 0.50    Types: Cigarettes  . Smokeless tobacco: Never Used  Substance Use Topics  . Alcohol use: Not Currently    Family History  Problem Relation Age of Onset  . Healthy Brother     ROS Per hpi  OBJECTIVE:  Today's Vitals   03/08/20 1120  BP: 116/80  Pulse: 79  Temp: 97.7 F (36.5 C)  SpO2: 96%  Weight: 277 lb (125.6 kg)  Height: 5\' 4"  (1.626 m)   Body mass index is 47.55 kg/m.   Physical Exam Vitals and nursing note reviewed.   Constitutional:      Appearance: She is well-developed.  HENT:     Head: Normocephalic and atraumatic.     Jaw: Tenderness (TMJ bilateral) present. No pain on movement.     Right Ear: Hearing, tympanic membrane, ear canal and external ear normal.     Left Ear: Ear canal and external ear normal. Decreased hearing noted. A middle ear effusion is present. No mastoid tenderness.     Ears:     Weber exam findings: does not lateralize.    Right Rinne: BC > AC.    Left Rinne: BC > AC.    Nose: Congestion present.  Eyes:     Conjunctiva/sclera: Conjunctivae normal.     Pupils: Pupils are equal, round, and reactive to light.  Cardiovascular:     Rate and Rhythm: Normal rate and regular rhythm.     Heart sounds: Normal heart sounds. No murmur heard.  No friction rub. No gallop.   Pulmonary:     Effort: Pulmonary effort is normal.     Breath sounds: Normal breath sounds. No wheezing, rhonchi or rales.  Musculoskeletal:     Cervical back: Neck supple.  Lymphadenopathy:     Cervical: No cervical adenopathy.  Skin:    General: Skin is warm and dry.  Neurological:     Mental Status: She is alert and oriented to person, place, and time.     No results found for this or any previous visit (from the past 24 hour(s)).  No results found.   ASSESSMENT and PLAN  1. Acute dysfunction of left eustachian tube Discussed consistent flonase use. Add oral decongestant use. Adding singulair given cont allergic and asthma sx. Referring to specialist  2. Mild persistent asthma without complication - Ambulatory referral to Allergy  3. Environmental allergies - Ambulatory referral to Allergy  Other orders - montelukast (SINGULAIR) 10 MG tablet; Take 1 tablet (10 mg total) by mouth at bedtime.  No follow-ups on file.    Rutherford Guys, MD Primary Care at [place], [place] [address] Ph.  [phone] Fax [phone]

## 2020-03-08 NOTE — Patient Instructions (Signed)
° ° ° °  If you have lab work done today you will be contacted with your lab results within the next 2 weeks.  If you have not heard from us then please contact us. The fastest way to get your results is to register for My Chart. ° ° °IF you received an x-ray today, you will receive an invoice from [facility]. Please contact [facility] at [phone] with questions or concerns regarding your invoice.  ° °IF you received labwork today, you will receive an invoice from [facility]. Please contact [facility] at [phone] with questions or concerns regarding your invoice.  ° °Our billing staff will not be able to assist you with questions regarding bills from these companies. ° °You will be contacted with the lab results as soon as they are available. The fastest way to get your results is to activate your My Chart account. Instructions are located on the last page of this paperwork. If you have not heard from us regarding the results in 2 weeks, please contact this office. °  ° ° ° °

## 2020-04-06 ENCOUNTER — Ambulatory Visit (HOSPITAL_COMMUNITY): Payer: Medicare Other | Admitting: Psychiatry

## 2020-04-06 ENCOUNTER — Other Ambulatory Visit: Payer: Self-pay

## 2020-04-27 ENCOUNTER — Ambulatory Visit (HOSPITAL_COMMUNITY): Payer: Medicare Other | Admitting: Psychiatry

## 2020-04-27 ENCOUNTER — Other Ambulatory Visit: Payer: Self-pay

## 2020-04-28 ENCOUNTER — Ambulatory Visit: Payer: Medicare Other | Admitting: Allergy & Immunology

## 2020-05-03 ENCOUNTER — Telehealth: Payer: Self-pay | Admitting: Family Medicine

## 2020-05-03 NOTE — Telephone Encounter (Signed)
Referral Followup °

## 2020-05-23 DIAGNOSIS — M1990 Unspecified osteoarthritis, unspecified site: Secondary | ICD-10-CM | POA: Diagnosis not present

## 2020-05-23 DIAGNOSIS — Z79899 Other long term (current) drug therapy: Secondary | ICD-10-CM | POA: Diagnosis not present

## 2020-05-23 DIAGNOSIS — E559 Vitamin D deficiency, unspecified: Secondary | ICD-10-CM | POA: Diagnosis not present

## 2020-05-23 DIAGNOSIS — J309 Allergic rhinitis, unspecified: Secondary | ICD-10-CM | POA: Diagnosis not present

## 2020-05-23 DIAGNOSIS — K219 Gastro-esophageal reflux disease without esophagitis: Secondary | ICD-10-CM | POA: Diagnosis not present

## 2020-06-17 DIAGNOSIS — J309 Allergic rhinitis, unspecified: Secondary | ICD-10-CM | POA: Diagnosis not present

## 2020-06-17 DIAGNOSIS — K219 Gastro-esophageal reflux disease without esophagitis: Secondary | ICD-10-CM | POA: Diagnosis not present

## 2020-06-17 DIAGNOSIS — Z0001 Encounter for general adult medical examination with abnormal findings: Secondary | ICD-10-CM | POA: Diagnosis not present

## 2020-06-21 DIAGNOSIS — M170 Bilateral primary osteoarthritis of knee: Secondary | ICD-10-CM | POA: Diagnosis not present

## 2020-08-17 NOTE — Telephone Encounter (Signed)
got fax to call pt to rech apt. [email or web address].

## 2020-08-26 ENCOUNTER — Encounter (HOSPITAL_COMMUNITY): Payer: Self-pay

## 2020-08-26 ENCOUNTER — Ambulatory Visit (HOSPITAL_COMMUNITY)
Admission: EM | Admit: 2020-08-26 | Discharge: 2020-08-26 | Disposition: A | Discharge status: Home / Self Care | Payer: Medicare HMO | Attending: Psychiatry | Admitting: Psychiatry

## 2020-08-26 ENCOUNTER — Other Ambulatory Visit: Payer: Self-pay

## 2020-08-26 DIAGNOSIS — F603 Borderline personality disorder: Secondary | ICD-10-CM | POA: Insufficient documentation

## 2020-08-26 DIAGNOSIS — F418 Other specified anxiety disorders: Secondary | ICD-10-CM | POA: Insufficient documentation

## 2020-08-26 DIAGNOSIS — F439 Reaction to severe stress, unspecified: Secondary | ICD-10-CM | POA: Diagnosis not present

## 2020-08-26 DIAGNOSIS — F430 Acute stress reaction: Secondary | ICD-10-CM | POA: Insufficient documentation

## 2020-08-26 DIAGNOSIS — F32A Depression, unspecified: Secondary | ICD-10-CM | POA: Insufficient documentation

## 2020-08-26 NOTE — ED Provider Notes (Signed)
[facility] Medical Screening Exam  Patient Name: Olivia Vargas MRN: [id number] Date of Evaluation: 08/26/20 Chief Complaint: Chief Complaint/Presenting Problem: NA Diagnosis:  Final diagnoses:  Situational stress    History of Present illness: Olivia Vargas is a 40 y.o. female who presents to [facility] voluntarily requesting "Klonipin to get me through the weekend. I will be back on Monday for therapy". Patient reports increased stress related to "not being able to pay my rent". Patient reports that she did receive her SSI payment on yesterday and it was enough to cover her current bills but endorses having "that feeling". Patient states she currently doesn't have any money for Patient reports previously being prescribed Wellbutrin, Klonipin, and Adderall. States last received prescription "7 or 8 months ago". Patient is not currently receiving any outpatient care.   Patient has a past psychiatric history of Borderline Personality Disorder, Depression, and Anxiety. Patient last seen by therapist 01/2020 and by provider (Dr Adele Schilder) "8 or 9 months ago" after patient reports "he wouldn't prescribe me my medications".   Patient currently denies having any suicidal/homicidal ideations, auditory/visual hallucinations, and does not appear to be responding to any external/internal stimulation.  Provider explained to patient the inability to prescribe her any controlled substances at this time and inquired about the intensity of her current feelings and whether she felt the need to stay overnight for observation. Patient declined and patient further denied any feelings of endangerment to herself or others and does not feel she is in need of any inpatient or overnight psychiatric care at this time. Patient to be discharged with outpatient resources for individual follow-up.   Psychiatric Specialty Exam  Presentation  General Appearance:Casual  Eye Contact:Fair  Speech:Clear and  Coherent  Speech Volume:Normal  Handedness:Right   Mood and Affect  Mood:Dysphoric  Affect:Appropriate; Congruent   Thought Process  Thought Processes:Goal Directed  Descriptions of Associations:Intact  Orientation:Full (Time, Place and Person)  Thought Content:Logical  Hallucinations:None  Ideas of Reference:None  Suicidal Thoughts:No  Homicidal Thoughts:No   Sensorium  Memory:Immediate Fair; Recent Fair; Remote Fair  Judgment:Fair  Insight:Fair   Executive Functions  Concentration:Fair  Attention Span:Fair  [place]   Psychomotor Activity  Psychomotor Activity:Normal   Assets  Assets:Communication Skills; Financial Resources/Insurance; Housing; Physical Health; Transportation   Sleep  Sleep:Fair  Number of hours: No data recorded  Physical Exam: Physical Exam Psychiatric:        Attention and Perception: Attention and perception normal.        Mood and Affect: Affect is blunt.        Speech: Speech normal.        Behavior: Behavior is cooperative.        Thought Content: Thought content normal.        Cognition and Memory: Cognition and memory normal.        Judgment: Judgment normal.    ROS Blood pressure 126/83, pulse 68, temperature 97.7 F (36.5 C), temperature source Oral, resp. rate 18, height 5\' 4"  (1.626 m), weight 300 lb (136.1 kg), SpO2 98 %. Body mass index is 51.49 kg/m.  Musculoskeletal: Strength & Muscle Tone: within normal limits Gait & Station: normal Patient leans: N/A   [place] MSE Discharge Disposition for Follow up and Recommendations: Based on my evaluation the patient does not appear to have an emergency medical condition and can be discharged with resources and follow up care in outpatient services for Medication Management and Individual Therapy   Jerene Pitch  A Leevy-Johnson, NP 08/26/2020, 5:25 PM

## 2020-08-26 NOTE — ED Notes (Signed)
Discharge instructions provided and Pt stated understanding. Personal belongings returned. Pt orient, alert or ambulatory. Safety maintained.

## 2020-08-26 NOTE — ED Triage Notes (Signed)
URGENT--40 yo female walk-in endorsing hopelessness and tearful. Pt states, "I can't seem to get back on my game". Pt states being off medication for 8-9 months due to being new in the area and can't find a provider to continue her medication. Pt reports, "At this time, I don't care if I live or die". Denies SI/HI/AVH.
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
## 2020-10-26 ENCOUNTER — Ambulatory Visit (HOSPITAL_COMMUNITY)
Admission: EM | Admit: 2020-10-26 | Discharge: 2020-10-26 | Disposition: A | Discharge status: Home / Self Care | Payer: Medicare HMO

## 2020-10-26 NOTE — Progress Notes (Signed)
Per TTS, pt went upstairs to open access.

## 2022-01-14 IMAGING — DX DG CHEST 2V
2 series · 2 of 2 positions shown · non-contrast
Comparison: None.

CLINICAL DATA: Cough and congestion

EXAM:
CHEST - 2 VIEW

[chest pa]
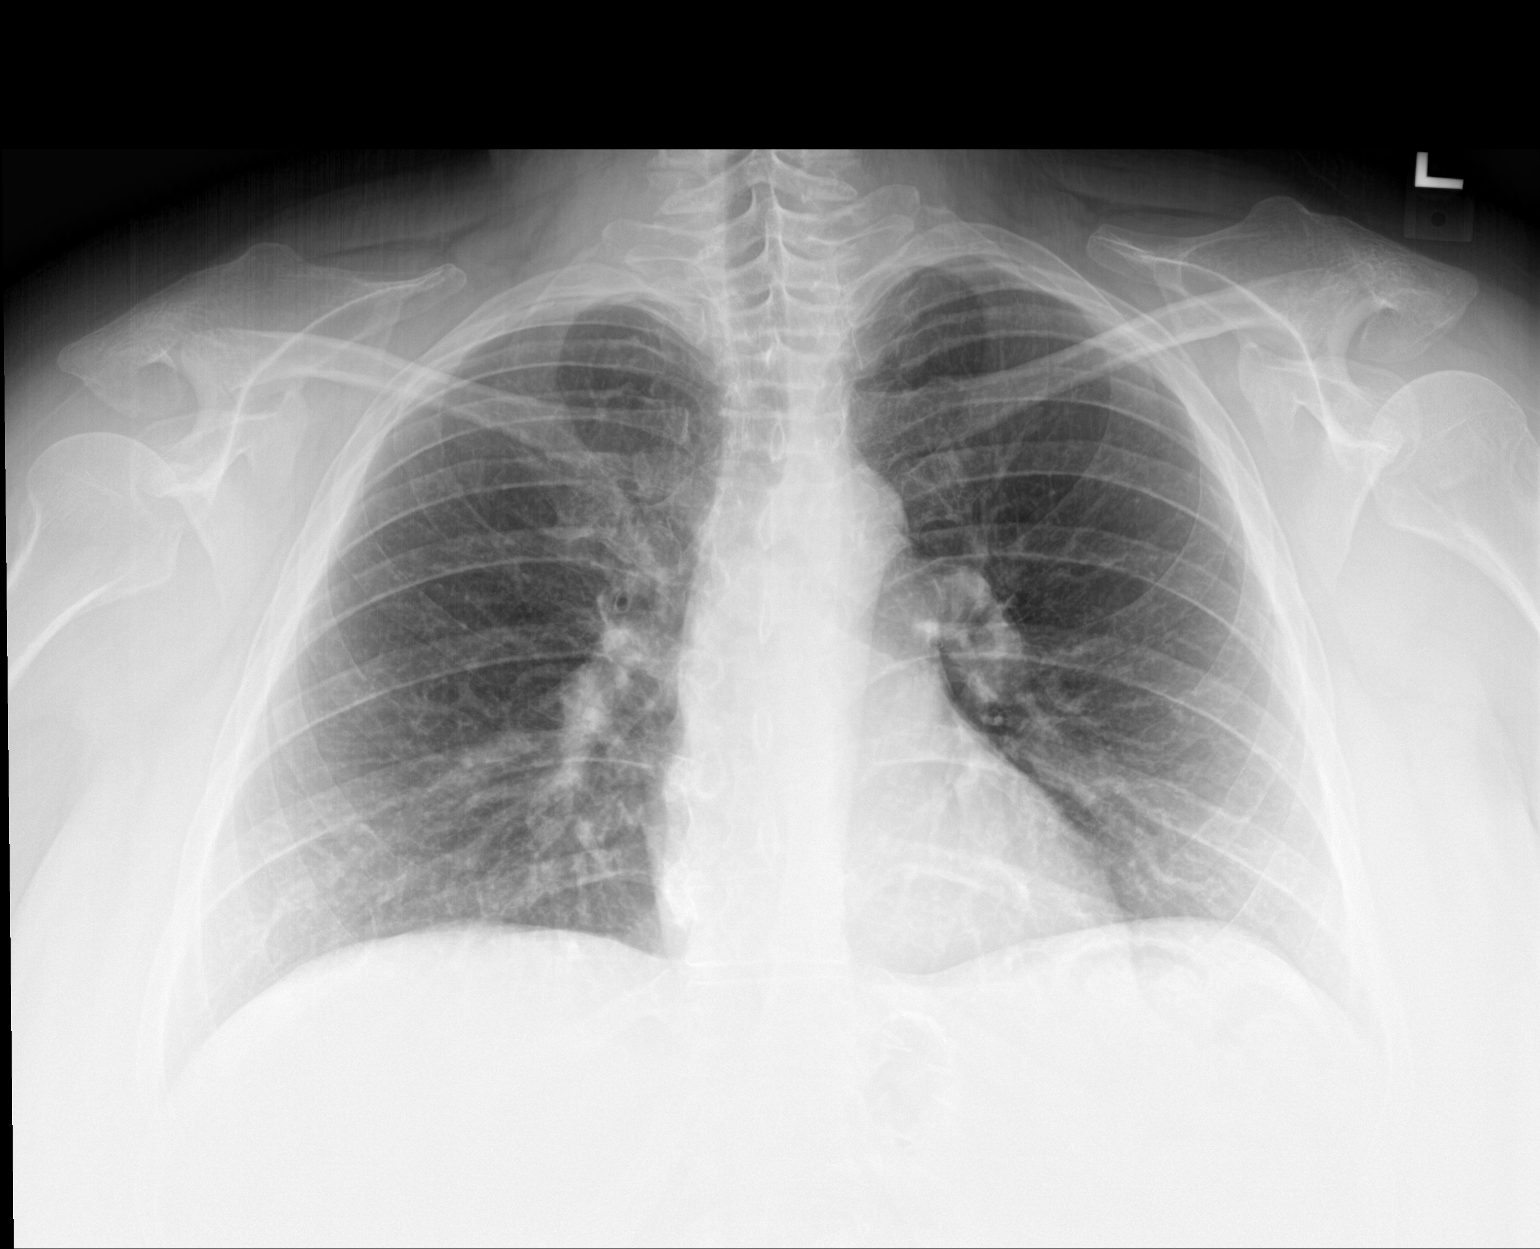

[chest lat]
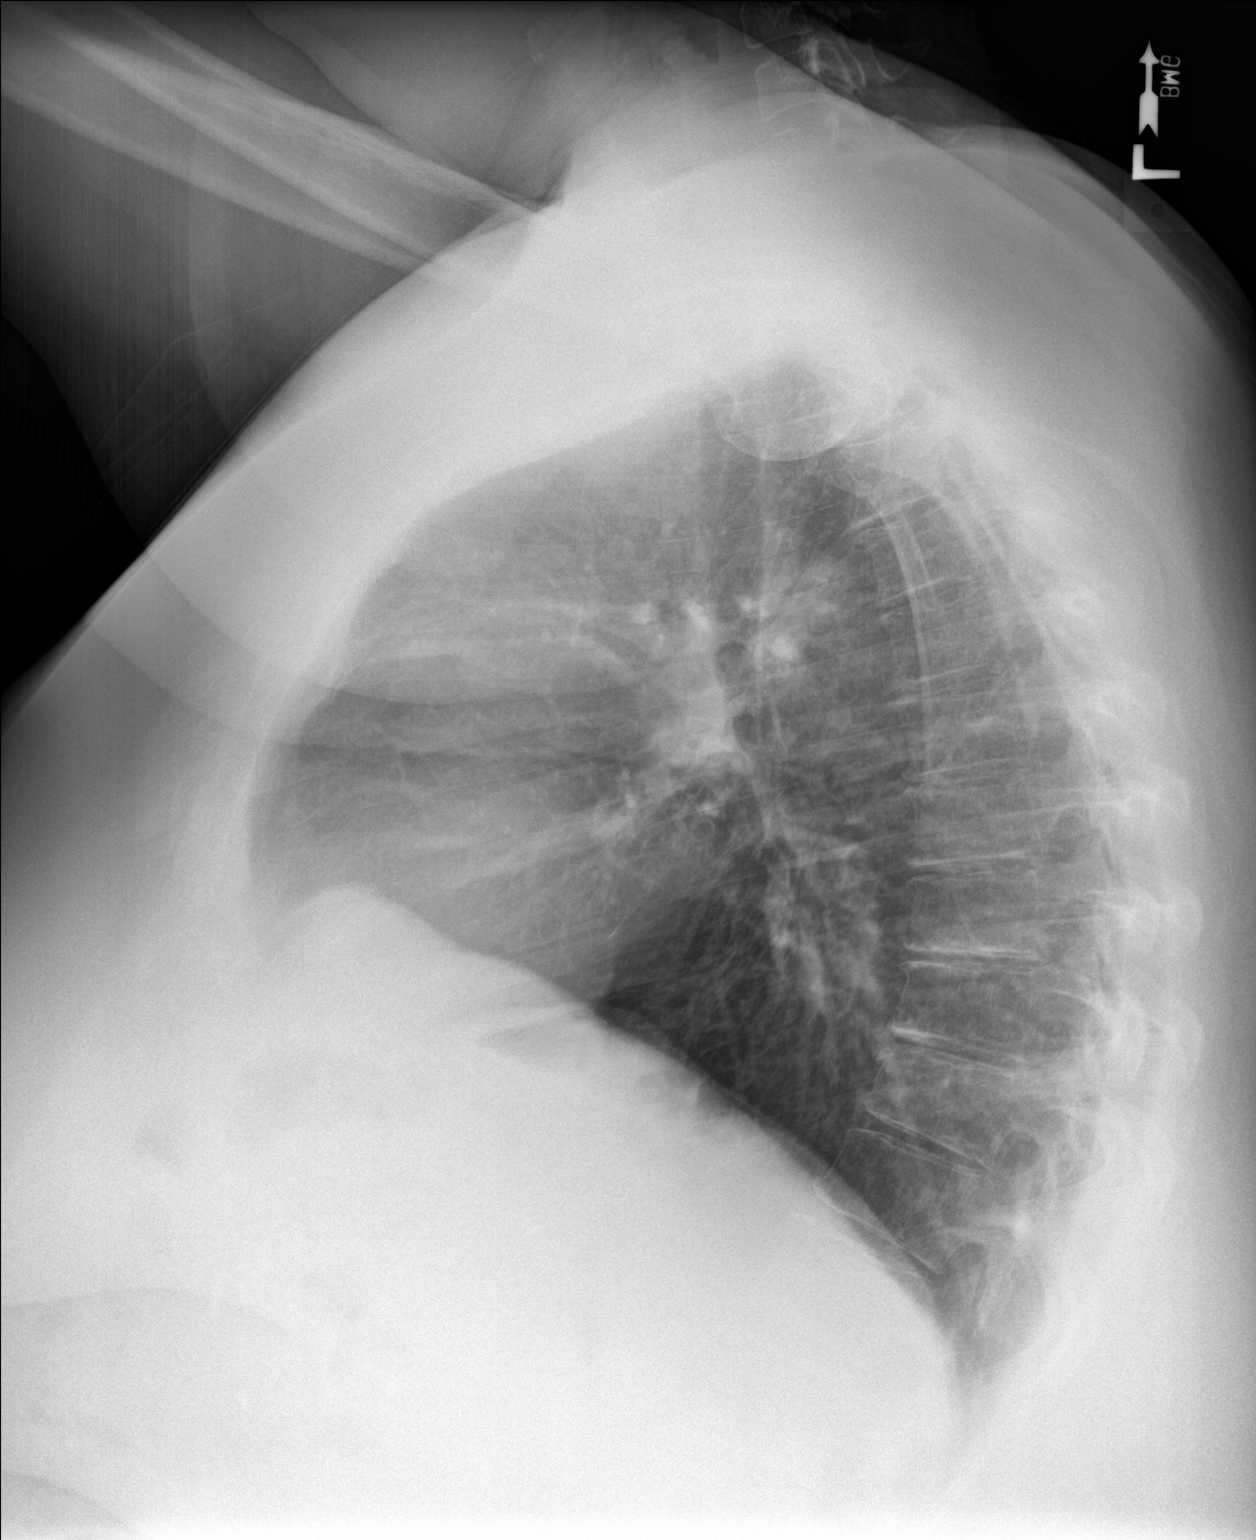

[2 of 2 positions shown; findings below may reference images not displayed]

FINDINGS: The heart size and mediastinal contours are within normal limits.
Both lungs are clear. The visualized skeletal structures are
unremarkable.
IMPRESSION: No active cardiopulmonary disease.
